# Patient Record
Sex: Male | Born: 1990 | Race: White | Hispanic: No | Marital: Single | Smoking: Current some day smoker
Health system: Southern US, Community
[De-identification: ages and names within clinical notes are randomized; demographics above are authoritative.]

---
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
## 2016-09-22 ENCOUNTER — Other Ambulatory Visit: Payer: Self-pay | Admitting: Ophthalmology

## 2016-09-22 DIAGNOSIS — S43102D Unspecified dislocation of left acromioclavicular joint, subsequent encounter: Secondary | ICD-10-CM

## 2016-10-06 ENCOUNTER — Ambulatory Visit
Admission: RE | Admit: 2016-10-06 | Discharge: 2016-10-06 | Disposition: A | Discharge status: Home / Self Care | Payer: 59 | Source: Ambulatory Visit | Attending: Ophthalmology | Admitting: Ophthalmology

## 2016-10-06 DIAGNOSIS — S43102D Unspecified dislocation of left acromioclavicular joint, subsequent encounter: Secondary | ICD-10-CM | POA: Diagnosis not present

## 2016-10-06 DIAGNOSIS — X58XXXD Exposure to other specified factors, subsequent encounter: Secondary | ICD-10-CM | POA: Diagnosis not present

## 2017-09-11 ENCOUNTER — Ambulatory Visit: Payer: Self-pay | Admitting: Family Medicine
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
## 2022-11-22 ENCOUNTER — Ambulatory Visit
Admission: EM | Admit: 2022-11-22 | Discharge: 2022-11-22 | Disposition: A | Discharge status: Home / Self Care | Payer: No Typology Code available for payment source | Attending: Urgent Care | Admitting: Urgent Care

## 2022-11-22 DIAGNOSIS — K121 Other forms of stomatitis: Secondary | ICD-10-CM | POA: Insufficient documentation

## 2022-11-22 MED ORDER — NYSTATIN 100000 UNIT/ML MT SUSP: 5.0000 mL | Freq: Four times a day (QID) | OROMUCOSAL | 0 refills | Status: AC | PRN

## 2022-11-22 MED ORDER — TRIAMCINOLONE ACETONIDE 0.1 % MT PSTE: 1.0000 | PASTE | Freq: Two times a day (BID) | OROMUCOSAL | 12 refills | Status: AC

## 2022-11-22 NOTE — ED Triage Notes (Signed)
Pt c/o swelling in mouth started 7/17 after eating a sandwich-NAD-steady gait

## 2022-11-22 NOTE — ED Provider Notes (Signed)
[facility]    CSN: [id number] Arrival date & time: 11/22/22  1254      History   Chief Complaint Chief Complaint  Patient presents with   Oral Swelling    HPI Stanley Morgan is a 32 y.o. male.   32 year old male patient presents to due to concerns of swelling of his gums and ulcers in his mouth.  States the symptoms started on 11/19/2022 after eating a sandwich.  He started feeling a tingling sensation.  Over the past few days, he states it is spread across his gums, cheeks, tongue and feels like it is to the back of his throat at this point.  He denies a food allergy.  He states it is painful to swallow, but is not having difficulty.  No fever.  He had an issue similar to this over a year ago in which she was given Valtrex for suspected herpes.  He states the Valtrex did not improve his symptoms.  Patient reports the majority of the pain is between his gums and his lips to the superior portion of his jaw, it also hurts near his maxillary sinuses.  He denies any rash anywhere else on his body.  He has not taken any recent antibiotics.  He denies any known food allergies.  No drooling or trismus.  No additional URI symptoms.  He denies any personal or family history of autoimmune conditions. His partner does have hx of HSV2.     History reviewed. No pertinent past medical history.  There are no problems to display for this patient.   History reviewed. No pertinent surgical history.     Home Medications    Prior to Admission medications   Medication Sig Start Date End Date Taking? Authorizing Provider  magic mouthwash (nystatin, lidocaine, diphenhydrAMINE, alum & mag hydroxide) suspension Swish and swallow 5 mLs 4 (four) times daily as needed for mouth pain. 11/22/22  Yes Carrisa Keller L, PA  triamcinolone (KENALOG) 0.1 % paste Use as directed 1 Application in the mouth or throat 2 (two) times daily. 11/22/22  Yes Camil Wilhelmsen, Jodelle Gross, PA    Family History History  reviewed. No pertinent family history.  Social History Social History   Tobacco Use   Smoking status: Some Days    Types: Cigarettes   Smokeless tobacco: Current  Vaping Use   Vaping status: Never Used  Substance Use Topics   Alcohol use: Yes    Comment: occ   Drug use: Never     Allergies   Patient has no known allergies.   Review of Systems Review of Systems As per HPI  Physical Exam Triage Vital Signs ED Triage Vitals  Encounter Vitals Group     BP 11/22/22 1329 130/89     Systolic BP Percentile --      Diastolic BP Percentile --      Pulse Rate 11/22/22 1329 64     Resp 11/22/22 1329 18     Temp 11/22/22 1329 98.9 F (37.2 C)     Temp Source 11/22/22 1329 Oral     SpO2 11/22/22 1329 96 %     Weight --      Height --      Head Circumference --      Peak Flow --      Pain Score 11/22/22 1336 2     Pain Loc --      Pain Education --      Exclude from Growth Chart --  No data found.  Updated Vital Signs BP 130/89 (BP Location: Right Arm)   Pulse 64   Temp 98.9 F (37.2 C) (Oral)   Resp 18   SpO2 96%   Visual Acuity Right Eye Distance:   Left Eye Distance:   Bilateral Distance:    Right Eye Near:   Left Eye Near:    Bilateral Near:     Physical Exam Vitals and nursing note reviewed. Exam conducted with a chaperone present.  Constitutional:      General: He is not in acute distress.    Appearance: Normal appearance. He is normal weight. He is not ill-appearing, toxic-appearing or diaphoretic.  HENT:     Head: Normocephalic and atraumatic.     Right Ear: External ear normal.     Left Ear: External ear normal.     Nose: No congestion or rhinorrhea.     Mouth/Throat:     Lips: Pink. Lesions present.     Mouth: Mucous membranes are moist. Oral lesions present. No injury, lacerations or angioedema.     Dentition: Gingival swelling (surrounding the ulcerations) present. No dental abscesses.     Pharynx: Oropharynx is clear. Uvula midline. No  pharyngeal swelling, oropharyngeal exudate, posterior oropharyngeal erythema, uvula swelling or postnasal drip.   Cardiovascular:     Rate and Rhythm: Normal rate.  Pulmonary:     Effort: Pulmonary effort is normal. No respiratory distress.  Musculoskeletal:     Cervical back: Normal range of motion and neck supple. No rigidity or tenderness.  Lymphadenopathy:     Cervical: No cervical adenopathy.  Skin:    General: Skin is warm and dry.     Coloration: Skin is not jaundiced.     Findings: No bruising, erythema or rash.  Neurological:     General: No focal deficit present.     Mental Status: He is alert and oriented to person, place, and time.     Cranial Nerves: No cranial nerve deficit.      UC Treatments / Results  Labs (all labs ordered are listed, but only abnormal results are displayed) Labs Reviewed  HSV CULTURE AND TYPING    EKG   Radiology No results found.  Procedures Procedures (including critical care time)  Medications Ordered in UC Medications - No data to display  Initial Impression / Assessment and Plan / UC Course  I have reviewed the triage vital signs and the nursing notes.  Pertinent labs & imaging results that were available during my care of the patient were reviewed by me and considered in my medical decision making (see chart for details).     Stomatitis - likely HSV vs aphthous ulceration. HSV swab performed on two of the ulcerations. Pt however did not have clinical response to valtrex in the past therefore will refrain from use today. Will start triamcinolone dental paste and magic mouthwash. Avoidance of triggering foods discussed. Pt will f/u with a new PCP to monitor regression of current ulcerations. May need to consider autoimmune workup if recurrent or swab negative.   Final Clinical Impressions(s) / UC Diagnoses   Final diagnoses:  Stomatitis     Discharge Instructions      You have stomatitis of the mouth, which causes  painful ulcerations. We obtained a swab to test to see if this is related to the herpes virus. We will call with the results once received.  Avoid hot beverages and foods as well as salty spicy and citrus based foods.  Ibuprofen  and Tylenol can be used for discomfort.  Gargle with cool water or suck on ice pops if this causes a burning sensation in your mouth.  You may swish and swallow 5 mL of the Magic mouthwash up to 4 times daily to help with the discomfort.  Apply the triamcinolone dental paste directly to the ulcerations on your gums and buccal mucosa.  I would not recommend applying this to the back of your throat.  Should your symptoms persist, please follow-up with a new primary care physician to have a further workup for possible autoimmune causes.  Call the MD listed on this form to schedule a new patient appointment at your earliest convenience.     ED Prescriptions     Medication Sig Dispense Auth. Provider   magic mouthwash (nystatin, lidocaine, diphenhydrAMINE, alum & mag hydroxide) suspension Swish and swallow 5 mLs 4 (four) times daily as needed for mouth pain. 180 mL Keiasia Christianson L, PA   triamcinolone (KENALOG) 0.1 % paste Use as directed 1 Application in the mouth or throat 2 (two) times daily. 5 g Rebbecca Osuna L, [place]      PDMP not reviewed this encounter.   Maretta Bees, [place] 11/22/22 1411

## 2022-11-22 NOTE — Discharge Instructions (Addendum)
You have stomatitis of the mouth, which causes painful ulcerations. We obtained a swab to test to see if this is related to the herpes virus. We will call with the results once received.  Avoid hot beverages and foods as well as salty spicy and citrus based foods.  Ibuprofen and Tylenol can be used for discomfort.  Gargle with cool water or suck on ice pops if this causes a burning sensation in your mouth.  You may swish and swallow 5 mL of the Magic mouthwash up to 4 times daily to help with the discomfort.  Apply the triamcinolone dental paste directly to the ulcerations on your gums and buccal mucosa.  I would not recommend applying this to the back of your throat.  Should your symptoms persist, please follow-up with a new primary care physician to have a further workup for possible autoimmune causes.  Call the MD listed on this form to schedule a new patient appointment at your earliest convenience.

## 2022-11-25 LAB — HSV CULTURE AND TYPING
